# Patient Record
Sex: Female | Born: 1995 | Hispanic: Yes | Marital: Single | Smoking: Never smoker
Health system: Southern US, Community
[De-identification: ages and names within clinical notes are randomized; demographics above are authoritative.]

---
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
## 2017-01-08 ENCOUNTER — Encounter (HOSPITAL_BASED_OUTPATIENT_CLINIC_OR_DEPARTMENT_OTHER): Payer: Self-pay | Admitting: *Deleted

## 2017-01-08 ENCOUNTER — Emergency Department (HOSPITAL_BASED_OUTPATIENT_CLINIC_OR_DEPARTMENT_OTHER)
Admission: EM | Admit: 2017-01-08 | Discharge: 2017-01-08 | Disposition: A | Discharge status: Home / Self Care | Payer: Medicaid Other | Attending: Emergency Medicine | Admitting: Emergency Medicine

## 2017-01-08 DIAGNOSIS — R51 Headache: Secondary | ICD-10-CM | POA: Diagnosis not present

## 2017-01-08 DIAGNOSIS — R519 Headache, unspecified: Secondary | ICD-10-CM

## 2017-01-08 MED ORDER — KETOROLAC TROMETHAMINE 30 MG/ML IJ SOLN
15.0000 mg | Freq: Once | INTRAMUSCULAR | Status: AC
  Administered 2017-01-08: 15 mg via INTRAVENOUS
  Filled 2017-01-08: qty 1

## 2017-01-08 MED ORDER — SODIUM CHLORIDE 0.9 % IV BOLUS (SEPSIS)
1000.0000 mL | Freq: Once | INTRAVENOUS | Status: AC
  Administered 2017-01-08: 1000 mL via INTRAVENOUS

## 2017-01-08 NOTE — ED Provider Notes (Signed)
[facility]   CSN: [id number] Arrival date & time: 01/08/17  1436     History   Chief Complaint Chief Complaint  Patient presents with  . Headache    HPI Heather Hampton is a 21 y.o. female.  The history is provided by the patient and medical records. No language interpreter was used.  Headache     Heather LeschZayda Eberlein is an otherwise healthy 21 y.o. female who presents to the Emergency Department complaining of headache which started yesterday. She took an aspirin while at work and headache resolved. While she was at work today, her headache returned. She tried no medications prior to arrival for symptoms. No visual changes, nausea or vomiting. Sometimes the light will make it worse. No phonophobia. Denies history of migraine headaches. She also notes that she has been feeling lightheaded today. Denies any dizziness. No syncopal episodes. She is currently on her menstrual cycle and having mild  intermittent abdominal cramping, but this is typical for her. No dysuria, urinary urgency or frequency. No fevers chills.   History reviewed. No pertinent past medical history.  There are no active problems to display for this patient.   History reviewed. No pertinent surgical history.  OB History    No data available       Home Medications    Prior to Admission medications   Not on File    Family History History reviewed. No pertinent family history.  Social History Social History  Substance Use Topics  . Smoking status: Never Smoker  . Smokeless tobacco: Never Used  . Alcohol use No     Allergies   Patient has no known allergies.   Review of Systems Review of Systems  Gastrointestinal:       + abdominal cramping  Neurological: Positive for light-headedness and headaches. Negative for dizziness, syncope and weakness.  All other systems reviewed and are negative.    Physical Exam Updated Vital Signs BP 109/66 (BP Location: Left Arm)    Pulse 82   Temp 98.2 F (36.8 C) (Oral)   Resp 16   Ht 5\' 2"  (1.575 m)   Wt 81.6 kg (179 lb 14.3 oz)   LMP 01/08/2017   SpO2 100%   BMI 32.90 kg/m   Physical Exam  Constitutional: She is oriented to person, place, and time. She appears well-developed and well-nourished. No distress.  HENT:  Head: Normocephalic and atraumatic.  Eyes: Pupils are equal, round, and reactive to light. EOM are normal.  Cardiovascular: Normal rate, regular rhythm and normal heart sounds.   No murmur heard. Pulmonary/Chest: Effort normal and breath sounds normal. No respiratory distress.  Abdominal: Soft. Bowel sounds are normal. She exhibits no distension.  No abdominal tenderness.  Musculoskeletal: Normal range of motion.  Neurological: She is alert and oriented to person, place, and time.  Speech clear and goal oriented. CN 2-12 grossly intact. Normal finger-to-nose and rapid alternating movements. No drift. Strength and sensation intact. Steady gait.  Skin: Skin is warm and dry.  Nursing note and vitals reviewed.    ED Treatments / Results  Labs (all labs ordered are listed, but only abnormal results are displayed) Labs Reviewed - No data to display  EKG  EKG Interpretation None       Radiology No results found.  Procedures Procedures (including critical care time)  Medications Ordered in ED Medications  sodium chloride 0.9 % bolus 1,000 mL (1,000 mLs Intravenous New Bag/Given 01/08/17 1527)  ketorolac (TORADOL) 30 MG/ML injection  15 mg (15 mg Intravenous Given 01/08/17 1526)     Initial Impression / Assessment and Plan / ED Course  I have reviewed the triage vital signs and the nursing notes.  Pertinent labs & imaging results that were available during my care of the patient were reviewed by me and considered in my medical decision making (see chart for details).    Heather LeschZayda Teas is a 21 y.o. female who presents to ED for headache and lightheadedness. No focal neuro deficits  on exam. Well appearing and ambulating in ED without difficulty. Toradol and IV fluids given for symptom control.   On re-evaluation, patient feels improved. Symptoms resolved. The patient denies any neurologic symptoms such as visual changes, focal numbness/weakness, balance problems, confusion, or speech difficulty to suggest a life-threatening intracranial process such as intracranial hemorrhage or mass. The patient has no clotting risk factors thus venous sinus thrombosis is unlikely. No fevers, neck pain or nuchal rigidity to suggest meningitis. I feel that the patient is safe for discharge home at this time. PCP follow up if symptoms persist. I have reviewed return precautions including development of neurologic symptoms, confusion, lethargy, difficulty speaking, or new/worsening/concerning symptoms. All questions answered.   Final Clinical Impressions(s) / ED Diagnoses   Final diagnoses:  Bad headache    New Prescriptions New Prescriptions   No medications on file     Luree Palla, Chase PicketJaime Pilcher, Cordelia Poche-C 01/08/17 1608    Linwood DibblesKnapp, Jon, MD 01/12/17 [phone]

## 2017-01-08 NOTE — ED Triage Notes (Signed)
HA, light headed that started today. Ambulatory.

## 2017-01-08 NOTE — Discharge Instructions (Signed)
It was my pleasure taking care of you today!  Drink plenty of fluids at home. This will help with your headache.  Please follow up with your primary doctor if headache persists.   Fortunately, your evaluation today is reassuring with no apparent emergent cause for your headache at this time. With that being said, it is VERY important that you monitor your symptoms at home. If you develop worsening headache, new fever, new neck stiffness, rash, weakness, numbness, trouble with your speech, trouble walking, new or worsening symptoms or any concerning symptoms, please return to the ED immediately.

## 2017-06-17 ENCOUNTER — Other Ambulatory Visit: Payer: Self-pay

## 2017-06-17 ENCOUNTER — Encounter (HOSPITAL_BASED_OUTPATIENT_CLINIC_OR_DEPARTMENT_OTHER): Payer: Self-pay

## 2017-06-17 ENCOUNTER — Emergency Department (HOSPITAL_BASED_OUTPATIENT_CLINIC_OR_DEPARTMENT_OTHER)
Admission: EM | Admit: 2017-06-17 | Discharge: 2017-06-18 | Disposition: A | Discharge status: Home / Self Care | Payer: Medicaid Other | Attending: Emergency Medicine | Admitting: Emergency Medicine

## 2017-06-17 ENCOUNTER — Emergency Department (HOSPITAL_BASED_OUTPATIENT_CLINIC_OR_DEPARTMENT_OTHER): Payer: Medicaid Other

## 2017-06-17 DIAGNOSIS — R0981 Nasal congestion: Secondary | ICD-10-CM | POA: Diagnosis present

## 2017-06-17 DIAGNOSIS — B9789 Other viral agents as the cause of diseases classified elsewhere: Secondary | ICD-10-CM

## 2017-06-17 DIAGNOSIS — R05 Cough: Secondary | ICD-10-CM | POA: Diagnosis not present

## 2017-06-17 DIAGNOSIS — J069 Acute upper respiratory infection, unspecified: Secondary | ICD-10-CM | POA: Insufficient documentation

## 2017-06-17 MED ORDER — ACETAMINOPHEN 325 MG PO TABS
650.0000 mg | ORAL_TABLET | Freq: Once | ORAL | Status: AC
  Administered 2017-06-17: 650 mg via ORAL
  Filled 2017-06-17: qty 2

## 2017-06-17 NOTE — ED Triage Notes (Signed)
C/o flu like sx x 3 days-NAD-steady gait 

## 2017-06-18 MED ORDER — ALBUTEROL SULFATE HFA 108 (90 BASE) MCG/ACT IN AERS
2.0000 | INHALATION_SPRAY | RESPIRATORY_TRACT | Status: DC | PRN
  Administered 2017-06-18: 2 via RESPIRATORY_TRACT
  Filled 2017-06-18: qty 6.7

## 2017-06-18 MED ORDER — OXYMETAZOLINE HCL 0.05 % NA SOLN
2.0000 | Freq: Two times a day (BID) | NASAL | Status: DC | PRN
  Administered 2017-06-18: 2 via NASAL
  Filled 2017-06-18: qty 15

## 2017-06-18 NOTE — ED Notes (Signed)
Pt verbalizes understanding of d/c instructions and denies any further needs at this time. 

## 2017-06-18 NOTE — ED Provider Notes (Signed)
[facility] Provider Note: [name], MD, FACEP  CSN: [id number] MRN: [id number] ARRIVAL: 06/17/17 at 2235 ROOM: [facility]10/[facility]10   CHIEF COMPLAINT  Cough   HISTORY OF PRESENT ILLNESS  06/18/17 12:30 AM Heather Hampton is a 22 y.o. female with a 3-day history of flulike symptoms.  Specifically she has had subjective fever, malaise, nasal congestion, rhinorrhea, sore throat and cough.  She had vomiting the first day but this is resolved.  She has not had diarrhea.  The coughing exacerbates her sore throat.  She rates her pain as a 5 out of 10.  She has not taken anything for her symptoms.   History reviewed. No pertinent past medical history.  History reviewed. No pertinent surgical history.  No family history on file.  Social History   Tobacco Use  . Smoking status: Never Smoker  . Smokeless tobacco: Never Used  Substance Use Topics  . Alcohol use: No  . Drug use: No    Prior to Admission medications   Not on File    Allergies Patient has no known allergies.   REVIEW OF SYSTEMS  Negative except as noted here or in the History of Present Illness.   PHYSICAL EXAMINATION  Initial Vital Signs Blood pressure 131/66, pulse 99, temperature 98.2 F (36.8 C), temperature source Oral, resp. rate 16, height 5\' 2"  (1.575 m), weight 83 kg (182 lb 15.7 oz), last menstrual period 06/09/2017, SpO2 100 %.  Examination General: Well-developed, well-nourished female in no acute distress; appearance consistent with age of record HENT: normocephalic; atraumatic; nasal congestion; pharynx normal Eyes: pupils equal, round and reactive to light; extraocular muscles intact Neck: supple Heart: regular rate and rhythm Lungs: Creased air movement bilaterally without frank wheezing Abdomen: soft; nondistended; nontender; bowel sounds present Extremities: No deformity; full range of motion; pulses normal Neurologic: Awake, alert and oriented; motor function intact in all  extremities and symmetric; no facial droop Skin: Warm and dry Psychiatric: Normal mood and affect   RESULTS  Summary of this visit's results, reviewed by myself:   EKG Interpretation  Date/Time:    Ventricular Rate:    PR Interval:    QRS Duration:   QT Interval:    QTC Calculation:   R Axis:     Text Interpretation:        Laboratory Studies: No results found for this or any previous visit (from the past 24 hour(s)). Imaging Studies: Dg Chest 2 View  Result Date: 06/18/2017 CLINICAL DATA:  Cough.  Flu-like symptoms for 3 days. EXAM: CHEST  2 VIEW COMPARISON:  None. FINDINGS: The cardiomediastinal contours are normal. The lungs are clear. Pulmonary vasculature is normal. No consolidation, pleural effusion, or pneumothorax. No acute osseous abnormalities are seen. IMPRESSION: No active cardiopulmonary disease. Electronically Signed   By: Rubye Oaks M.D.   On: 06/18/2017 00:17    ED COURSE  Nursing notes and initial vitals signs, including pulse oximetry, reviewed.  Vitals:   06/17/17 2245 06/17/17 2246 06/17/17 2353  BP:  131/66   Pulse:  (!) 114 99  Resp:  16   Temp:  98.7 F (37.1 C) 98.2 F (36.8 C)  TempSrc:  Oral Oral  SpO2:  99% 100%  Weight: 83 kg (182 lb 15.7 oz)    Height: 5\' 2"  (1.575 m)     1:00 AM Air movement improved after albuterol treatment.  PROCEDURES    ED DIAGNOSES     ICD-10-CM   1. Viral URI with cough J06.9  B97.89        Marcele Kosta, Jonny RuizJohn, MD 06/18/17 0100

## 2018-11-22 IMAGING — CR DG CHEST 2V
2 series · 2 of 2 positions shown · non-contrast
Comparison: None.

CLINICAL DATA: Cough.  Flu-like symptoms for 3 days.

EXAM:
CHEST  2 VIEW

[w chest pa]
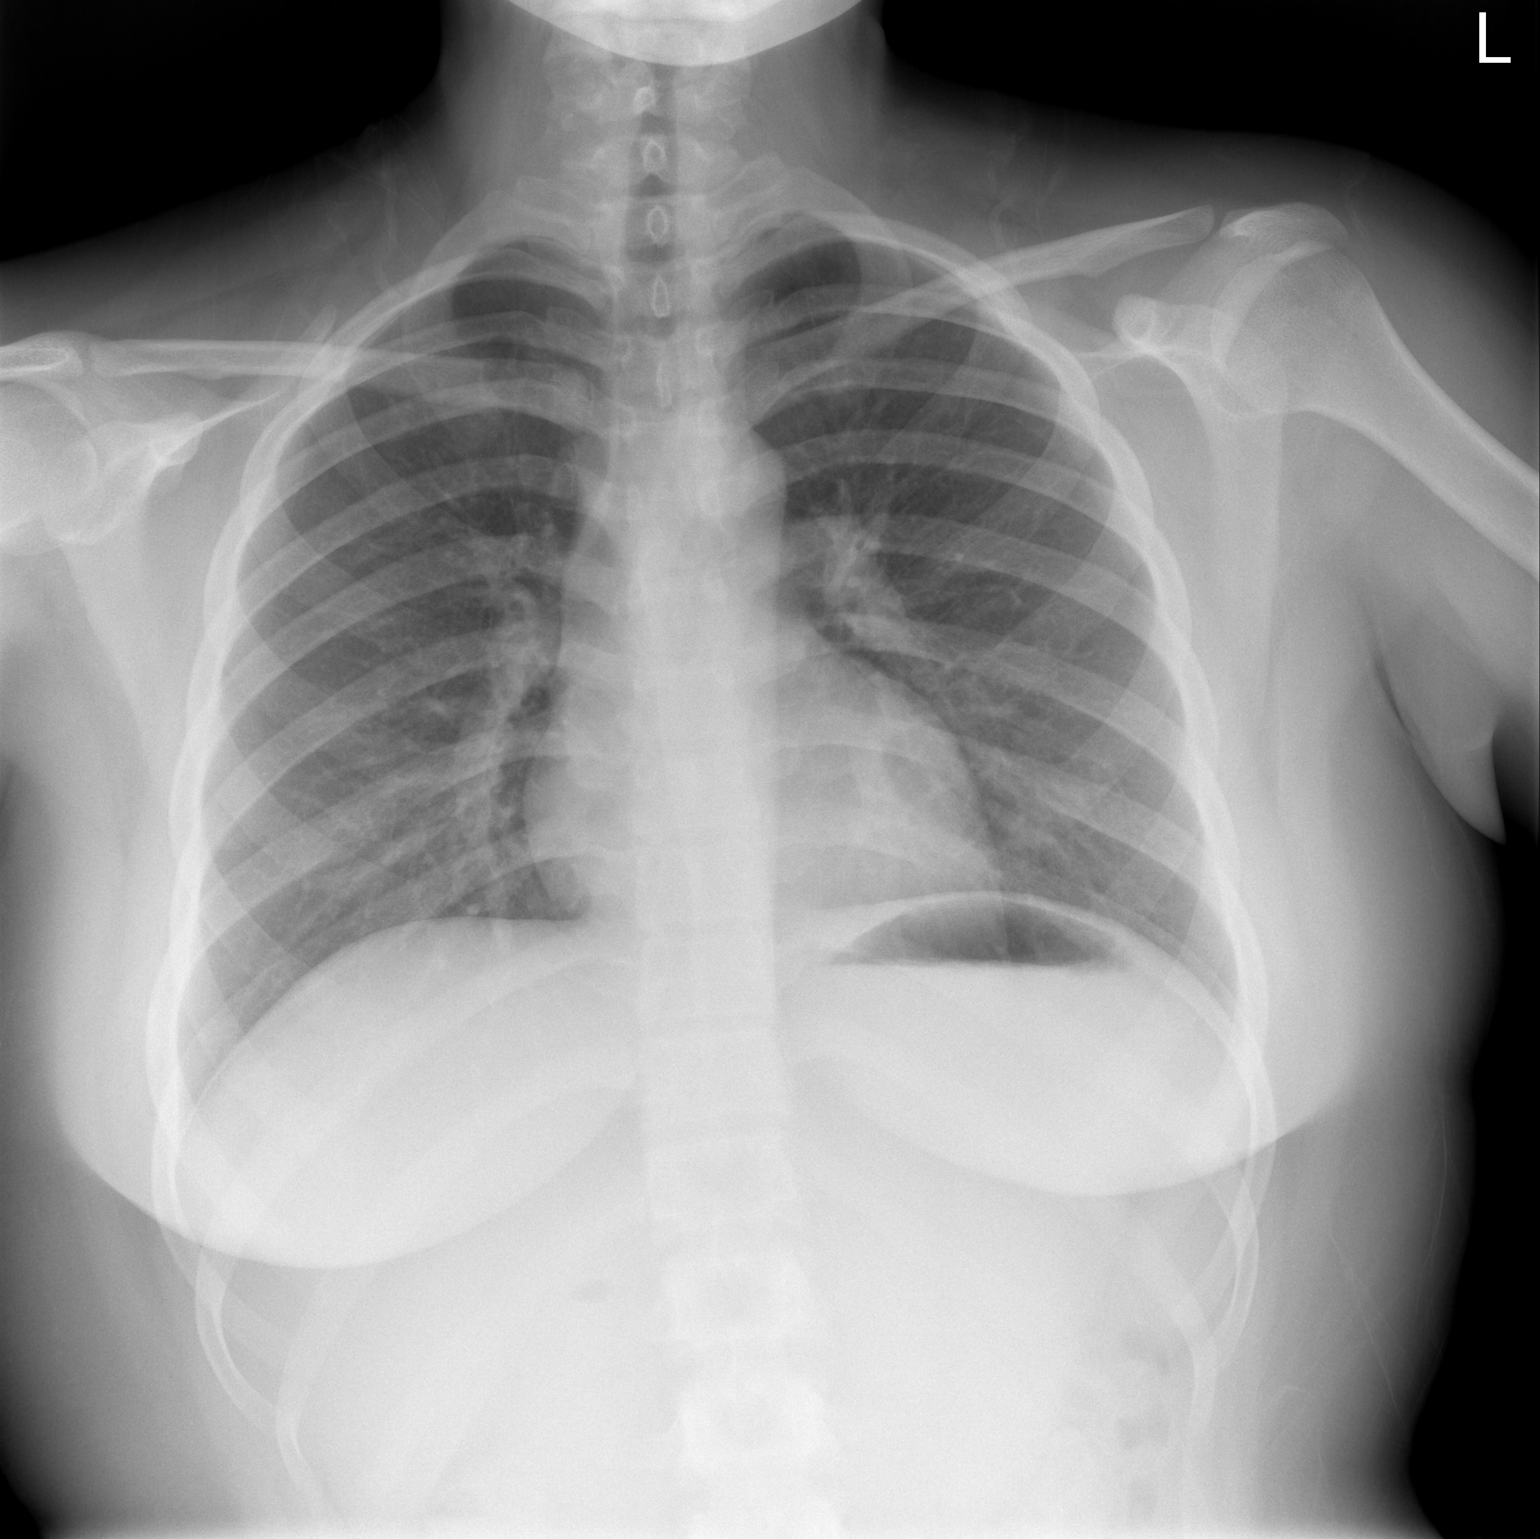

[w chest lat]
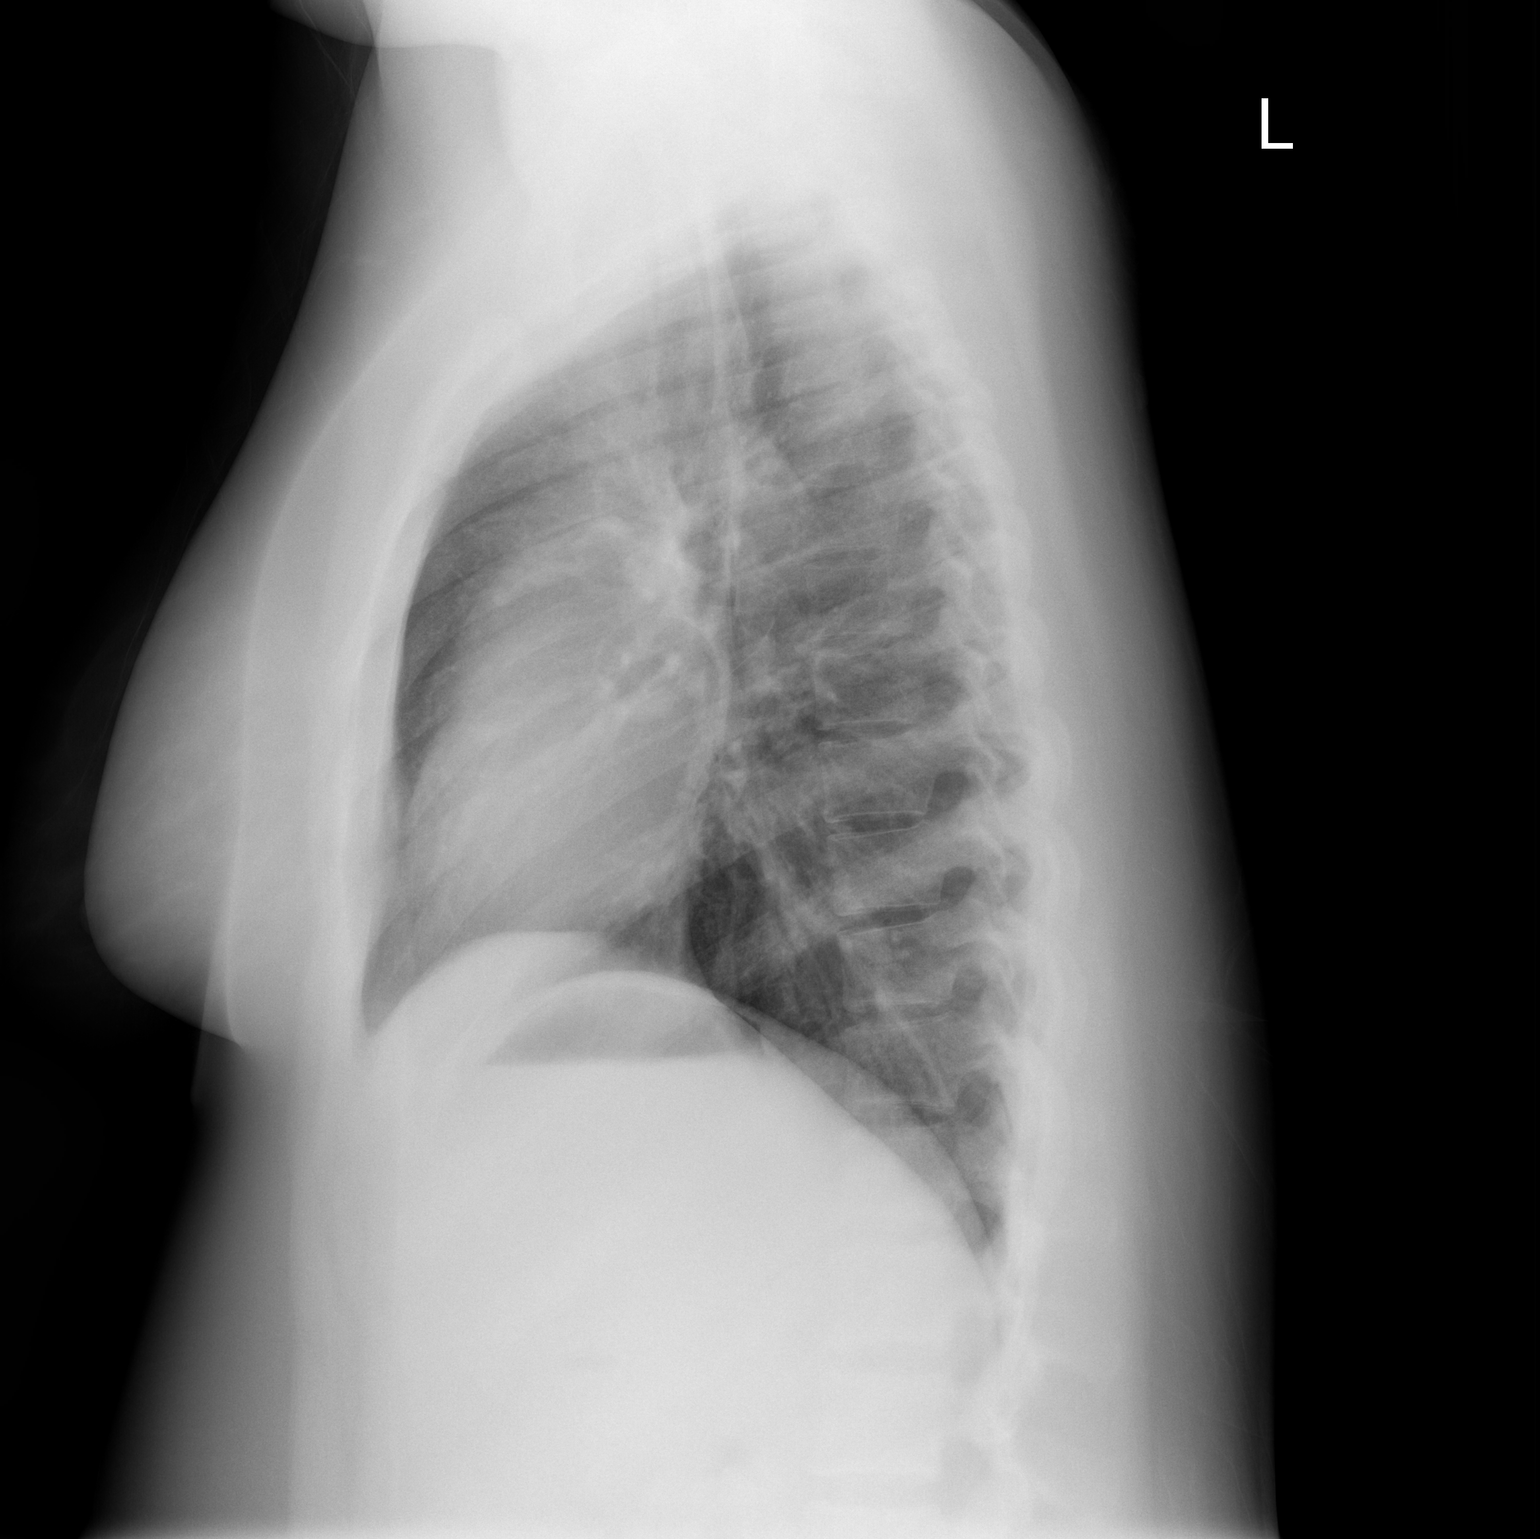

[2 of 2 positions shown; findings below may reference images not displayed]

FINDINGS: The cardiomediastinal contours are normal. The lungs are clear.
Pulmonary vasculature is normal. No consolidation, pleural effusion,
or pneumothorax. No acute osseous abnormalities are seen.
IMPRESSION: No active cardiopulmonary disease.

## 2019-09-20 ENCOUNTER — Emergency Department (HOSPITAL_COMMUNITY)
Admission: EM | Admit: 2019-09-20 | Discharge: 2019-09-20 | Disposition: A | Discharge status: Home / Self Care | Payer: Medicaid Other | Attending: Emergency Medicine | Admitting: Emergency Medicine

## 2019-09-20 ENCOUNTER — Emergency Department (HOSPITAL_COMMUNITY): Payer: Medicaid Other

## 2019-09-20 ENCOUNTER — Other Ambulatory Visit: Payer: Self-pay

## 2019-09-20 ENCOUNTER — Encounter (HOSPITAL_COMMUNITY): Payer: Self-pay | Admitting: Emergency Medicine

## 2019-09-20 DIAGNOSIS — Z20822 Contact with and (suspected) exposure to covid-19: Secondary | ICD-10-CM

## 2019-09-20 DIAGNOSIS — R519 Headache, unspecified: Secondary | ICD-10-CM | POA: Diagnosis not present

## 2019-09-20 DIAGNOSIS — U071 COVID-19: Secondary | ICD-10-CM | POA: Diagnosis not present

## 2019-09-20 DIAGNOSIS — R5383 Other fatigue: Secondary | ICD-10-CM | POA: Diagnosis not present

## 2019-09-20 DIAGNOSIS — R05 Cough: Secondary | ICD-10-CM | POA: Insufficient documentation

## 2019-09-20 DIAGNOSIS — B349 Viral infection, unspecified: Secondary | ICD-10-CM

## 2019-09-20 DIAGNOSIS — M7918 Myalgia, other site: Secondary | ICD-10-CM | POA: Diagnosis present

## 2019-09-20 LAB — I-STAT BETA HCG BLOOD, ED (MC, WL, AP ONLY): I-stat hCG, quantitative: <5 m[IU]/mL (ref <5)

## 2019-09-20 LAB — CBC WITH DIFFERENTIAL/PLATELET
Abs Immature Granulocytes: 0.01 10*3/uL (ref 0.00–0.07)
Basophils Absolute: 0 10*3/uL (ref 0.0–0.1)
Basophils Relative: 0 %
Eosinophils Absolute: 0 10*3/uL (ref 0.0–0.5)
Eosinophils Relative: 1 %
HCT: 41.3 % (ref 36.0–46.0)
Hemoglobin: 13.7 g/dL (ref 12.0–15.0)
Immature Granulocytes: 0 %
Lymphocytes Relative: 22 %
Lymphs Abs: 1.2 10*3/uL (ref 0.7–4.0)
MCH: 28.2 pg (ref 26.0–34.0)
MCHC: 33.2 g/dL (ref 30.0–36.0)
MCV: 85 fL (ref 80.0–100.0)
Monocytes Absolute: 0.8 10*3/uL (ref 0.1–1.0)
Monocytes Relative: 15 %
Neutro Abs: 3.3 10*3/uL (ref 1.7–7.7)
Neutrophils Relative %: 62 %
Platelets: 251 10*3/uL (ref 150–400)
RBC: 4.86 MIL/uL (ref 3.87–5.11)
RDW: 12.8 % (ref 11.5–15.5)
WBC: 5.3 10*3/uL (ref 4.0–10.5)
nRBC: 0 % (ref 0.0–0.2)

## 2019-09-20 LAB — COMPREHENSIVE METABOLIC PANEL
ALT: 15 U/L (ref 0–44)
AST: 14 U/L — ABNORMAL LOW (ref 15–41)
Albumin: 4 g/dL (ref 3.5–5.0)
Alkaline Phosphatase: 122 U/L (ref 38–126)
Anion gap: 11 (ref 5–15)
BUN: 9 mg/dL (ref 6–20)
CO2: 26 mmol/L (ref 22–32)
Calcium: 9.1 mg/dL (ref 8.9–10.3)
Chloride: 103 mmol/L (ref 98–111)
Creatinine, Ser: 0.68 mg/dL (ref 0.44–1.00)
GFR calc Af Amer: >60 mL/min (ref >60)
GFR calc non Af Amer: >60 mL/min (ref >60)
Glucose, Bld: 96 mg/dL (ref 70–99)
Potassium: 3.8 mmol/L (ref 3.5–5.1)
Sodium: 140 mmol/L (ref 135–145)
Total Bilirubin: 0.6 mg/dL (ref 0.3–1.2)
Total Protein: 7.3 g/dL (ref 6.5–8.1)

## 2019-09-20 LAB — SARS CORONAVIRUS 2 (TAT 6-24 HRS): SARS Coronavirus 2: POSITIVE — AB

## 2019-09-20 MED ORDER — ACETAMINOPHEN 500 MG PO TABS
1000.0000 mg | ORAL_TABLET | Freq: Once | ORAL | Status: AC
  Administered 2019-09-20: 1000 mg via ORAL
  Filled 2019-09-20: qty 2

## 2019-09-20 MED ORDER — SODIUM CHLORIDE 0.9 % IV BOLUS
1000.0000 mL | Freq: Once | INTRAVENOUS | Status: AC
Start: 2019-09-20 — End: 2019-09-20
  Administered 2019-09-20: 09:00:00 1000 mL via INTRAVENOUS

## 2019-09-20 MED ORDER — IBUPROFEN 400 MG PO TABS
600.0000 mg | ORAL_TABLET | Freq: Once | ORAL | Status: AC
  Administered 2019-09-20: 09:00:00 600 mg via ORAL
  Filled 2019-09-20: qty 1

## 2019-09-20 MED ORDER — BENZONATATE 100 MG PO CAPS: 100.0000 mg | ORAL_CAPSULE | Freq: Three times a day (TID) | ORAL | 0 refills | Status: AC

## 2019-09-20 NOTE — ED Notes (Addendum)
Patient pulse stayed at 93 during ambulating. o2 stayed at 100%

## 2019-09-20 NOTE — Discharge Instructions (Addendum)
You have been diagnosed today with viral illness, suspected COVID-19 viral infection.  At this time there does not appear to be the presence of an emergent medical condition, however there is always the potential for conditions to change. Please read and follow the below instructions.  Please return to the Emergency Department immediately for any new or worsening symptoms. Please be sure to follow up with your Primary Care Provider within one week regarding your visit today; please call their office to schedule an appointment even if you are feeling better for a follow-up visit. Your Covid test is pending, please follow-up on your results on your MyChart account in the next 1-2 days.  Discuss the results of your Covid test with your primary care provider at your follow-up visit this week.  Please drink plenty of water and get plenty of rest.  You may use the Tessalon medication as prescribed to help with cough. You may take Ibuprofen (Advil, motrin) and Tylenol (acetaminophen) to relieve your pain.  You may take up to 400 MG (2 pills) of normal strength ibuprofen every 8 hours as needed.  In between doses of ibuprofen you make take tylenol, up to 500 mg (one extra strength pill).  Do not take more than 3,000 mg tylenol in a 24 hour period.  Please check all medication labels as many medications such as pain and cold medications may contain tylenol.  Do not drink alcohol while taking these medications.  Do not take other NSAID'S while taking ibuprofen (such as aleve or naproxen).  Please take ibuprofen with food to decrease stomach upset.  Get help right away if: You have trouble breathing. You have pain or pressure in your chest. You have confusion. You have bluish lips and fingernails. You have difficulty waking from sleep. You have any new/concerning or worsening of symptoms  Please read the additional information packets attached to your discharge summary.  Do not take your medicine if  develop  an itchy rash, swelling in your mouth or lips, or difficulty breathing; call 911 and seek immediate emergency medical attention if this occurs.  Note: Portions of this text may have been transcribed using voice recognition software. Every effort was made to ensure accuracy; however, inadvertent computerized transcription errors may still be present.

## 2019-09-20 NOTE — ED Triage Notes (Signed)
Pt c/o generalized body ache, fever and HA since last night, pt denies any change on taste or smell.

## 2019-09-20 NOTE — ED Provider Notes (Addendum)
[facility] EMERGENCY DEPARTMENT Provider Note   CSN: [id number] Arrival date & time: 09/20/19  0316     History Chief Complaint  Patient presents with  . Generalized Body Aches    Heather Hampton is a 24 y.o. female otherwise healthy no daily medication use.  Onset 2 days ago generalized body aches, rhinorrhea/congestion, nonproductive cough, mild headache, fatigue.  Describes headache mild frontal pressure sensation constant improved with ibuprofen, no aggravating factors, no radiation.  Rhinorrhea is clear,.  Generalized body aches are mild aching sensation nonradiating constant improved with ibuprofen.  Associated with intermittent tactile fever but has not measured fever at home.  She last took medication yesterday afternoon which was 2 ibuprofen with some relief no medication today.  She reports that her brother has recently gotten over a similar illness, unsure if he has COVID-19 virus.  Denies fall/injury, vision changes, sore throat, neck stiffness, chest pain/shortness of breath, hemoptysis, abdominal pain, nausea/vomiting, diarrhea, extremity swelling/color change, numbness/weakness, tingling or any additional concerns.  HPI     History reviewed. No pertinent past medical history.  There are no problems to display for this patient.   History reviewed. No pertinent surgical history.   OB History   No obstetric history on file.     No family history on file.  Social History   Tobacco Use  . Smoking status: Never Smoker  . Smokeless tobacco: Never Used  Substance Use Topics  . Alcohol use: No  . Drug use: No    Home Medications Prior to Admission medications   Medication Sig Start Date End Date Taking? Authorizing Provider  benzonatate (TESSALON) 100 MG capsule Take 1 capsule (100 mg total) by mouth every 8 (eight) hours. 09/20/19   Bill Salinas, PA-C    Allergies    Patient has no known allergies.  Review of Systems   Review of  Systems Ten systems are reviewed and are negative for acute change except as noted in the HPI  Physical Exam Updated Vital Signs BP 107/72 (BP Location: Left Arm)   Pulse 98   Temp 99.2 F (37.3 C) (Oral)   Resp 18   Ht 5\' 3"  (1.6 m)   Wt 83 kg   SpO2 100%   BMI 32.41 kg/m   Physical Exam Constitutional:      General: She is not in acute distress.    Appearance: Normal appearance. She is well-developed. She is not ill-appearing or diaphoretic.  HENT:     Head: Normocephalic and atraumatic.     Jaw: There is normal jaw occlusion.     Right Ear: External ear normal.     Left Ear: External ear normal.     Nose: Rhinorrhea present. Rhinorrhea is clear.     Right Sinus: No maxillary sinus tenderness or frontal sinus tenderness.     Left Sinus: No maxillary sinus tenderness or frontal sinus tenderness.     Mouth/Throat:     Mouth: Mucous membranes are moist.     Pharynx: Oropharynx is clear.  Eyes:     General: Vision grossly intact. Gaze aligned appropriately.     Extraocular Movements: Extraocular movements intact.     Conjunctiva/sclera: Conjunctivae normal.     Pupils: Pupils are equal, round, and reactive to light.     Comments: Visual fields grossly intact bilaterally  Neck:     Trachea: Trachea and phonation normal. No tracheal deviation.     Meningeal: Brudzinski's sign absent.  Cardiovascular:  Rate and Rhythm: Normal rate and regular rhythm.     Pulses: Normal pulses.     Heart sounds: Normal heart sounds.  Pulmonary:     Effort: Pulmonary effort is normal. No respiratory distress.     Breath sounds: Normal breath sounds.  Abdominal:     General: There is no distension.     Palpations: Abdomen is soft.     Tenderness: There is no abdominal tenderness. There is no guarding or rebound.  Musculoskeletal:        General: Normal range of motion.     Cervical back: Full passive range of motion without pain, normal range of motion and neck supple.     Right lower  leg: No edema.     Left lower leg: No edema.  Skin:    General: Skin is warm and dry.  Neurological:     Mental Status: She is alert.     GCS: GCS eye subscore is 4. GCS verbal subscore is 5. GCS motor subscore is 6.     Comments: Speech is clear and goal oriented, follows commands Major Cranial nerves without deficit, no facial droop Normal strength in upper and lower extremities bilaterally including dorsiflexion and plantar flexion, strong and equal grip strength Sensation normal to light and sharp touch Moves extremities without ataxia, coordination intact Normal finger to nose and rapid alternating movements Neg romberg, no pronator drift Normal gait Normal heel-shin and balance Patient is able to jump from one leg to another without difficulty  Psychiatric:        Behavior: Behavior normal.     ED Results / Procedures / Treatments   Labs (all labs ordered are listed, but only abnormal results are displayed) Labs Reviewed  COMPREHENSIVE METABOLIC PANEL - Abnormal; Notable for the following components:      Result Value   AST 14 (*)    All other components within normal limits  SARS CORONAVIRUS 2 (TAT 6-24 HRS)  CBC WITH DIFFERENTIAL/PLATELET  I-STAT BETA HCG BLOOD, ED ([facility], [facility], [facility] ONLY)    EKG None  Radiology DG Chest Portable 1 View  Result Date: 09/20/2019 CLINICAL DATA:  Generalized body ache, fever and headache since last evening. EXAM: PORTABLE CHEST 1 VIEW COMPARISON:  06/18/2017 FINDINGS: The cardiac silhouette, mediastinal and hilar contours are normal. The lungs are clear. No pleural effusions. No pulmonary lesions. The bony thorax is intact. IMPRESSION: Normal chest x-ray. Electronically Signed   By: Marijo Sanes M.D.   On: 09/20/2019 07:57    Procedures Procedures (including critical care time)  Medications Ordered in ED Medications  acetaminophen (TYLENOL) tablet 1,000 mg (1,000 mg Oral Given 09/20/19 0332)  sodium chloride 0.9 % bolus 1,000 mL (1,000  mLs Intravenous New Bag/Given 09/20/19 0838)  ibuprofen (ADVIL) tablet 600 mg (600 mg Oral Given 09/20/19 6283)    ED Course  I have reviewed the triage vital signs and the nursing notes.  Pertinent labs & imaging results that were available during my care of the patient were reviewed by me and considered in my medical decision making (see chart for details).     Laneshia Pina was evaluated in Emergency Department on 09/20/2019 for the symptoms described in the history of present illness. She was evaluated in the context of the global COVID-19 pandemic, which necessitated consideration that the patient might be at risk for infection with the SARS-CoV-2 virus that causes COVID-19. Institutional protocols and algorithms that pertain to the evaluation of patients at risk for COVID-19  are in a state of rapid change based on information released by regulatory bodies including the CDC and federal and state organizations. These policies and algorithms were followed during the patient's care in the ED.  MDM Rules/Calculators/A&P                     MDM Number of Diagnoses or Management Options Suspected COVID-19 virus infection: minor Viral illness: minor   Amount and/or Complexity of Data Reviewed Clinical lab tests: ordered and reviewed Tests in the radiology section of CPT: ordered and reviewed Discussion of test results with the performing providers: no Decide to obtain previous medical records or to obtain history from someone other than the patient: yes Obtain history from someone other than the patient: no Review and summarize past medical records: yes Discuss the patient with other providers: yes (Dr. Anitra Lauth) Independent visualization of images, tracings, or specimens: yes  Risk of Complications, Morbidity, and/or Mortality Presenting problems: low Diagnostic procedures: low Management options: minimal  Critical Care Total time providing critical care: (none)  Patient Progress  Patient progress: improved  24 year old female presents today with viral illness onset 2 days ago, rhinorrhea, congestion, mild headache, nonproductive cough, body aches and fatigue.  On arrival to ER she is febrile and tachycardic, this improved following Tylenol given in triage, patient feeling much better upon my evaluation.  Cranial nerves intact, no meningeal signs, airway clear, heart regular rate and rhythm, lungs clear, abdomen soft nontender, neurovascular intact to all 4 extremities without evidence of DVT, normal neuro exam, well-appearing laughing and in no acute distress.  Labs obtained in triage include CBC which is within normal limits no anemia or leukocytosis to suggest bacterial infection.  Pregnancy test negative.  CMP shows AST 14 otherwise within normal limits no emergent electrolyte derangement, evidence of kidney injury or elevation of LFTs.  Will give patient fluid bolus for suspect dehydration and additional ibuprofen has been several hours and she received Tylenol.  Will obtain outpatient Covid test and have nurses ambulate patient.  A chest x-ray was ordered in triage which radiologist reads as "normal chest x-ray".  I personally reviewed patient's chest x-ray and agree with radiologist interpretation no obvious abnormalities, lungs appear clear. - Patient received fluid bolus and ibuprofen reports she is feeling much better repeat vital signs within normal limits tachycardia and fever have resolved.  She is ambulated with nursing staff without hypoxia on room air she is requesting discharge.  Will discharge patient with Tessalon and have her follow-up on her Covid test as outpatient discussed results with her primary care provider.  Work note provided patient aware to quarantine for 10 days.  Suspect patient symptoms are secondary to viral illness given sick contact, possibly COVID-19 viral infection.  No evidence of meningitis, bacterial process or other emergent pathologies  requiring antibiotics or further work-up at this time.  At this time there does not appear to be any evidence of an acute emergency medical condition and the patient appears stable for discharge with appropriate outpatient follow up. Diagnosis was discussed with patient who verbalizes understanding of care plan and is agreeable to discharge. I have discussed return precautions with patient who verbalizes understanding of return precautions. Patient encouraged to follow-up with their PCP. All questions answered.  Patient's case discussed with Dr. Anitra Lauth who agrees with plan to discharge with follow-up.   Note: Portions of this report may have been transcribed using voice recognition software. Every effort was made to ensure accuracy; however, inadvertent  computerized transcription errors may still be present. Final Clinical Impression(s) / ED Diagnoses Final diagnoses:  Viral illness  Suspected COVID-19 virus infection    Rx / DC Orders ED Discharge Orders         Ordered    benzonatate (TESSALON) 100 MG capsule  Every 8 hours     09/20/19 1030           Bill Salinas, PA-C 09/20/19 1046    Elizabeth Palau 09/20/19 1047    Gwyneth Sprout, MD 09/21/19 [phone]

## 2019-12-25 ENCOUNTER — Other Ambulatory Visit: Payer: Self-pay

## 2019-12-25 ENCOUNTER — Encounter (HOSPITAL_BASED_OUTPATIENT_CLINIC_OR_DEPARTMENT_OTHER): Payer: Self-pay | Admitting: *Deleted

## 2019-12-25 ENCOUNTER — Emergency Department (HOSPITAL_BASED_OUTPATIENT_CLINIC_OR_DEPARTMENT_OTHER)
Admission: EM | Admit: 2019-12-25 | Discharge: 2019-12-25 | Disposition: A | Discharge status: Home / Self Care | Payer: Medicaid Other | Attending: Emergency Medicine | Admitting: Emergency Medicine

## 2019-12-25 DIAGNOSIS — G8929 Other chronic pain: Secondary | ICD-10-CM | POA: Insufficient documentation

## 2019-12-25 DIAGNOSIS — M533 Sacrococcygeal disorders, not elsewhere classified: Secondary | ICD-10-CM | POA: Diagnosis not present

## 2019-12-25 DIAGNOSIS — M549 Dorsalgia, unspecified: Secondary | ICD-10-CM | POA: Diagnosis present

## 2019-12-25 LAB — URINALYSIS, ROUTINE W REFLEX MICROSCOPIC
Bilirubin Urine: NEGATIVE
Glucose, UA: NEGATIVE mg/dL
Ketones, ur: NEGATIVE mg/dL
Leukocytes,Ua: NEGATIVE
Nitrite: NEGATIVE
Protein, ur: NEGATIVE mg/dL
Specific Gravity, Urine: >1.03 — ABNORMAL HIGH (ref 1.005–1.030)
pH: 6 (ref 5.0–8.0)

## 2019-12-25 LAB — PREGNANCY, URINE: Preg Test, Ur: NEGATIVE

## 2019-12-25 LAB — URINALYSIS, MICROSCOPIC (REFLEX)

## 2019-12-25 MED ORDER — NAPROXEN 250 MG PO TABS
500.0000 mg | ORAL_TABLET | Freq: Once | ORAL | Status: AC
  Administered 2019-12-25: 500 mg via ORAL
  Filled 2019-12-25: qty 2

## 2019-12-25 MED ORDER — NAPROXEN 500 MG PO TABS: 500.0000 mg | ORAL_TABLET | Freq: Two times a day (BID) | ORAL | 0 refills | Status: AC

## 2019-12-25 NOTE — Discharge Instructions (Addendum)
You were seen today for back pain.  Most of your pain is over your SI joint.  This is likely inflammatory in nature.  Take naproxen twice daily.  Follow-up with sports medicine if not improving.

## 2019-12-25 NOTE — ED Provider Notes (Signed)
[facility] EMERGENCY DEPARTMENT Provider Note   CSN: [id number] Arrival date & time: 12/25/19  0122     History Chief Complaint  Patient presents with  . Back Pain    Heather Hampton is a 24 y.o. female.  HPI     This is a 24 year old female who presents with back pain.  Patient reports several month history of back pain.  She states that generally is worse at night.  It is sometimes worse with certain positions and standing up.  There is no radiation of the pain.  She describes dull pain that is just right of midline.  Currently her pain is 4 out of 10.  She states that she has taken "some things from my menstrual cramps" in the past and this has improved her pain.  She denies weakness, numbness, tingling, bowel or bladder difficulty.  She denies any dysuria or hematuria.  Denies fevers or other symptoms.  History reviewed. No pertinent past medical history.  There are no problems to display for this patient.   History reviewed. No pertinent surgical history.   OB History   No obstetric history on file.     No family history on file.  Social History   Tobacco Use  . Smoking status: Never Smoker  . Smokeless tobacco: Never Used  Substance Use Topics  . Alcohol use: No  . Drug use: No    Home Medications Prior to Admission medications   Medication Sig Start Date End Date Taking? Authorizing Provider  benzonatate (TESSALON) 100 MG capsule Take 1 capsule (100 mg total) by mouth every 8 (eight) hours. 09/20/19   Harlene Salts A, PA-C  naproxen (NAPROSYN) 500 MG tablet Take 1 tablet (500 mg total) by mouth 2 (two) times daily. 12/25/19   [place] Riesen, Mayer Masker, MD    Allergies    Patient has no known allergies.  Review of Systems   Review of Systems  Constitutional: Negative for fever.  Respiratory: Negative for shortness of breath.   Cardiovascular: Negative for chest pain.  Gastrointestinal: Negative for abdominal pain, diarrhea and vomiting.    Genitourinary: Negative for dysuria, flank pain and hematuria.  Musculoskeletal: Positive for back pain.  Neurological: Negative for weakness and numbness.  All other systems reviewed and are negative.   Physical Exam Updated Vital Signs BP 121/76 (BP Location: Right Arm)   Pulse 100   Temp 98.9 F (37.2 C) (Oral)   Resp 18   Ht 1.575 m (5\' 2" )   Wt 88.5 kg   LMP 12/25/2019   SpO2 100%   BMI 35.67 kg/m   Physical Exam Vitals and nursing note reviewed.  Constitutional:      Appearance: She is well-developed. She is obese. She is not ill-appearing.  HENT:     Head: Normocephalic and atraumatic.     Mouth/Throat:     Mouth: Mucous membranes are moist.  Eyes:     Pupils: Pupils are equal, round, and reactive to light.  Cardiovascular:     Rate and Rhythm: Normal rate and regular rhythm.     Heart sounds: Normal heart sounds.  Pulmonary:     Effort: Pulmonary effort is normal. No respiratory distress.     Breath sounds: No wheezing.  Abdominal:     General: Bowel sounds are normal.     Palpations: Abdomen is soft.     Tenderness: There is no abdominal tenderness.  Musculoskeletal:     Cervical back: Neck supple.     Comments:  Tenderness to palpation right SI joint, no midline tenderness to palpation, step-off, deformity Negative straight leg raise  Skin:    General: Skin is warm and dry.  Neurological:     Mental Status: She is alert and oriented to person, place, and time.     Comments: 5 out of 5 strength bilateral lower extremities, normal reflexes  Psychiatric:        Mood and Affect: Mood normal.     ED Results / Procedures / Treatments   Labs (all labs ordered are listed, but only abnormal results are displayed) Labs Reviewed  URINALYSIS, ROUTINE W REFLEX MICROSCOPIC - Abnormal; Notable for the following components:      Result Value   Specific Gravity, Urine >1.030 (*)    Hgb urine dipstick SMALL (*)    All other components within normal limits   URINALYSIS, MICROSCOPIC (REFLEX) - Abnormal; Notable for the following components:   Bacteria, UA MANY (*)    All other components within normal limits  PREGNANCY, URINE    EKG None  Radiology No results found.  Procedures Procedures (including critical care time)  Medications Ordered in ED Medications  naproxen (NAPROSYN) tablet 500 mg (has no administration in time range)    ED Course  I have reviewed the triage vital signs and the nursing notes.  Pertinent labs & imaging results that were available during my care of the patient were reviewed by me and considered in my medical decision making (see chart for details).    MDM Rules/Calculators/A&P                           Patient presents with back pain.  Overall nontoxic vital signs are reassuring.  Her pain is described as acute on chronic.  She has reproducible pain over the right SI joint which may suggest some inflammation there.  She does get better when she takes medication for her menstrual cramps which is likely an anti-inflammatory medication.  No symptoms of cauda equina or sciatica.  She has no urinary symptoms and history and exam are not consistent with kidney stones.  Urinalysis with 0-5 white cells and many bacteria.  Doubt UTI.  Recommend anti-inflammatories and sports medicine follow-up as needed.  After history, exam, and medical workup I feel the patient has been appropriately medically screened and is safe for discharge home. Pertinent diagnoses were discussed with the patient. Patient was given return precautions.   Final Clinical Impression(s) / ED Diagnoses Final diagnoses:  Chronic SI joint pain    Rx / DC Orders ED Discharge Orders         Ordered    naproxen (NAPROSYN) 500 MG tablet  2 times daily     Discontinue  Reprint     12/25/19 1610           Shon Baton, MD 12/25/19 [phone]

## 2019-12-25 NOTE — ED Triage Notes (Signed)
C/o lower back pain for "months" states pain was worse today. Denies any injury. Denies any urinary symptoms. States pain comes and goes. Denies radiation of pain down her legs.  Has not taken anything for pain.

## 2020-01-11 ENCOUNTER — Telehealth: Payer: Self-pay | Admitting: Family Medicine

## 2020-01-11 NOTE — Telephone Encounter (Signed)
Cld ED referral frm Dr. Wilkie Aye for Appt set up w/ Dr. Haydee Monica answer, message left for pt to call office if appt wanted.  --glh

## 2021-02-23 IMAGING — DX DG CHEST 1V PORT
1 series · 1 of 1 positions shown · non-contrast
Comparison: 06/18/2017

CLINICAL DATA: Generalized body ache, fever and headache since last
evening.

EXAM:
PORTABLE CHEST 1 VIEW

[chest]
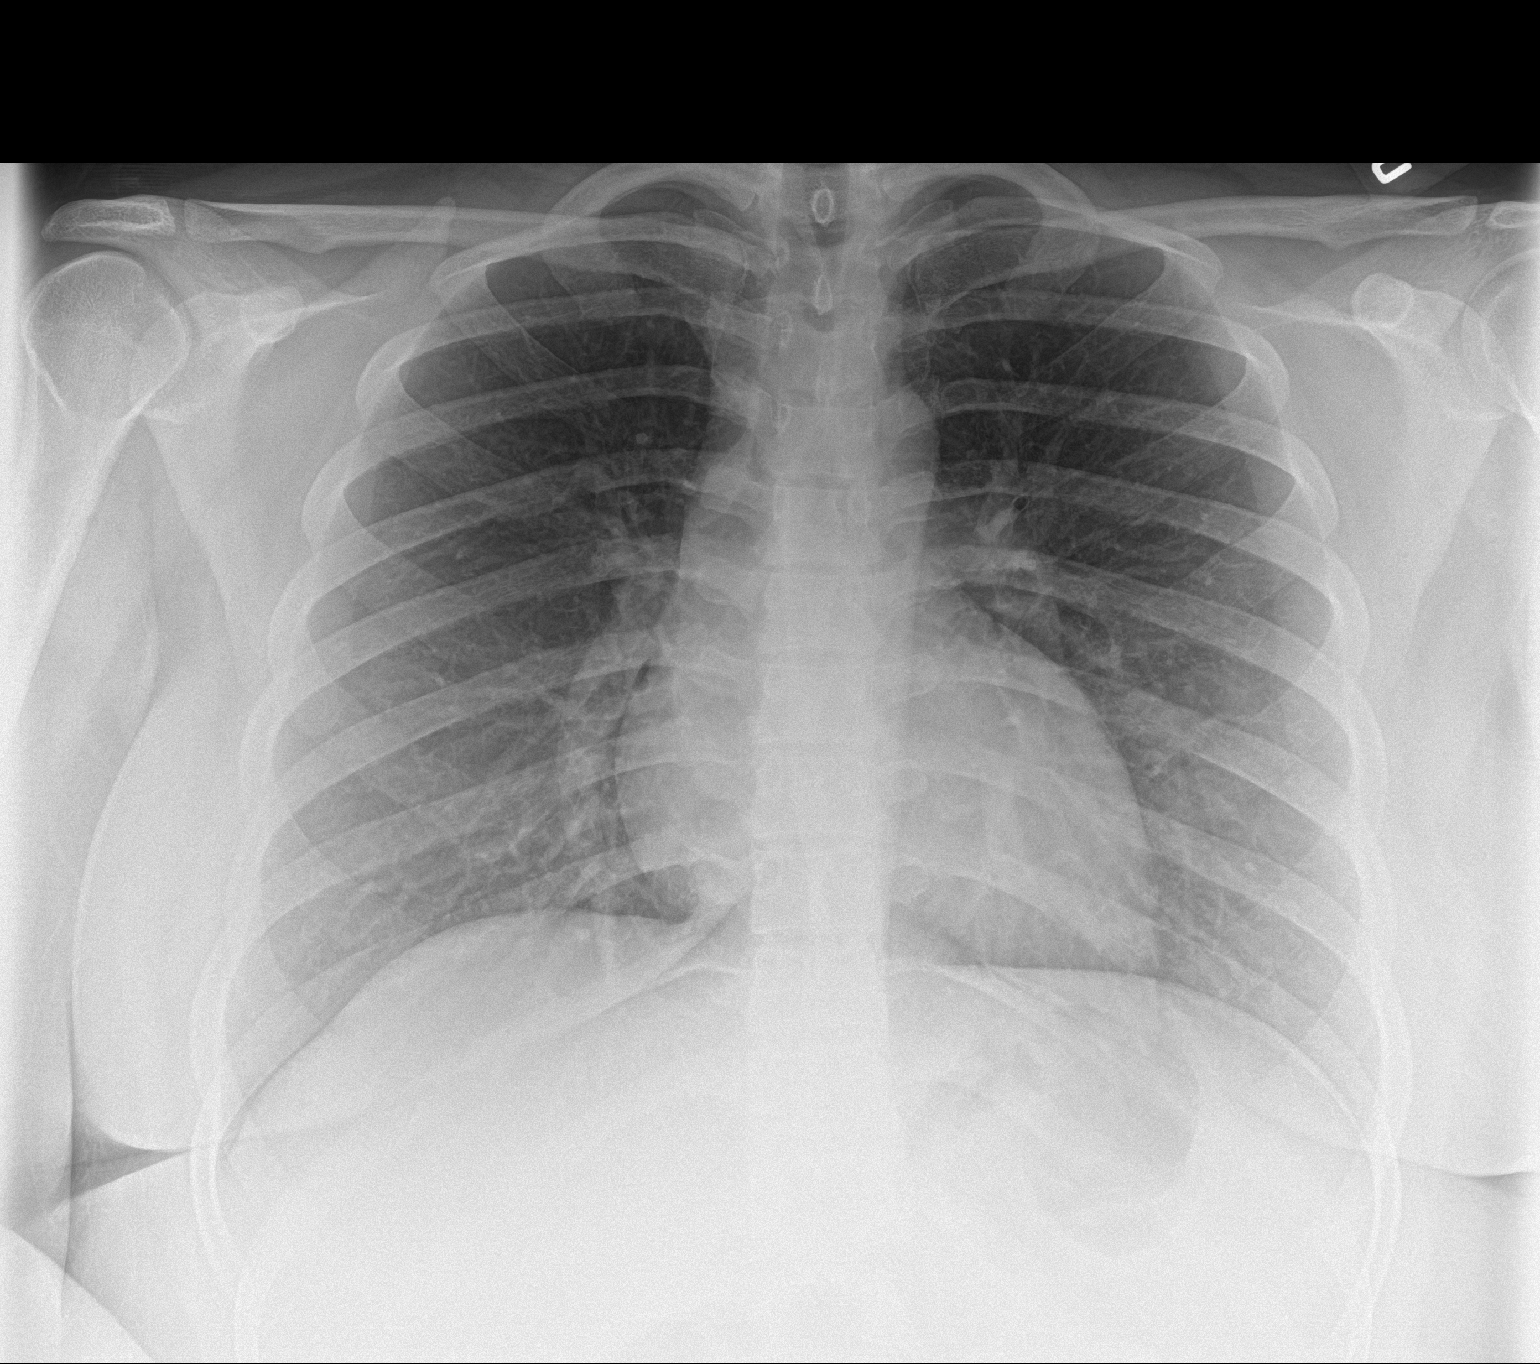

[1 of 1 positions shown; findings below may reference images not displayed]

FINDINGS: The cardiac silhouette, mediastinal and hilar contours are normal.
The lungs are clear. No pleural effusions. No pulmonary lesions. The
bony thorax is intact.
IMPRESSION: Normal chest x-ray.

## 2021-06-07 ENCOUNTER — Other Ambulatory Visit: Payer: Self-pay

## 2021-06-07 ENCOUNTER — Emergency Department
Admission: EM | Admit: 2021-06-07 | Discharge: 2021-06-07 | Disposition: A | Discharge status: Home / Self Care | Payer: Medicaid Other | Attending: Emergency Medicine | Admitting: Emergency Medicine

## 2021-06-07 DIAGNOSIS — R509 Fever, unspecified: Secondary | ICD-10-CM | POA: Diagnosis present

## 2021-06-07 DIAGNOSIS — Z28310 Unvaccinated for covid-19: Secondary | ICD-10-CM | POA: Diagnosis not present

## 2021-06-07 DIAGNOSIS — Z20822 Contact with and (suspected) exposure to covid-19: Secondary | ICD-10-CM | POA: Diagnosis not present

## 2021-06-07 DIAGNOSIS — J111 Influenza due to unidentified influenza virus with other respiratory manifestations: Secondary | ICD-10-CM

## 2021-06-07 LAB — CBC WITH DIFFERENTIAL/PLATELET
Abs Immature Granulocytes: 0.03 10*3/uL (ref 0.00–0.07)
Basophils Absolute: 0 10*3/uL (ref 0.0–0.1)
Basophils Relative: 0 %
Eosinophils Absolute: 0.2 10*3/uL (ref 0.0–0.5)
Eosinophils Relative: 2 %
HCT: 39.8 % (ref 36.0–46.0)
Hemoglobin: 13.8 g/dL (ref 12.0–15.0)
Immature Granulocytes: 0 %
Lymphocytes Relative: 20 %
Lymphs Abs: 2.2 10*3/uL (ref 0.7–4.0)
MCH: 28.4 pg (ref 26.0–34.0)
MCHC: 34.7 g/dL (ref 30.0–36.0)
MCV: 81.9 fL (ref 80.0–100.0)
Monocytes Absolute: 0.8 10*3/uL (ref 0.1–1.0)
Monocytes Relative: 7 %
Neutro Abs: 7.9 10*3/uL — ABNORMAL HIGH (ref 1.7–7.7)
Neutrophils Relative %: 71 %
Platelets: 326 10*3/uL (ref 150–400)
RBC: 4.86 MIL/uL (ref 3.87–5.11)
RDW: 12.8 % (ref 11.5–15.5)
WBC: 11.1 10*3/uL — ABNORMAL HIGH (ref 4.0–10.5)
nRBC: 0 % (ref 0.0–0.2)

## 2021-06-07 LAB — BASIC METABOLIC PANEL
Anion gap: 5 (ref 5–15)
BUN: 15 mg/dL (ref 6–20)
CO2: 25 mmol/L (ref 22–32)
Calcium: 8.9 mg/dL (ref 8.9–10.3)
Chloride: 106 mmol/L (ref 98–111)
Creatinine, Ser: 0.5 mg/dL (ref 0.44–1.00)
GFR, Estimated: >60 mL/min (ref >60)
Glucose, Bld: 137 mg/dL — ABNORMAL HIGH (ref 70–99)
Potassium: 3.6 mmol/L (ref 3.5–5.1)
Sodium: 136 mmol/L (ref 135–145)

## 2021-06-07 LAB — RESP PANEL BY RT-PCR (FLU A&B, COVID) ARPGX2
Influenza A by PCR: NEGATIVE
Influenza B by PCR: NEGATIVE
SARS Coronavirus 2 by RT PCR: NEGATIVE

## 2021-06-07 LAB — TSH: TSH: 2.818 u[IU]/mL (ref 0.350–4.500)

## 2021-06-07 LAB — MAGNESIUM: Magnesium: 2 mg/dL (ref 1.7–2.4)

## 2021-06-07 LAB — HCG, QUANTITATIVE, PREGNANCY: hCG, Beta Chain, Quant, S: 1 m[IU]/mL (ref <5)

## 2021-06-07 MED ORDER — KETOROLAC TROMETHAMINE 30 MG/ML IJ SOLN
30.0000 mg | Freq: Once | INTRAMUSCULAR | Status: AC
  Administered 2021-06-07: 09:00:00 30 mg via INTRAMUSCULAR
  Filled 2021-06-07: qty 1

## 2021-06-07 NOTE — ED Triage Notes (Signed)
Pt states she felt her heart racing, has a frontal headache and feels dizzy. Pt appears in no acute distress. Tp states symptoms began this am.

## 2021-06-07 NOTE — ED Provider Notes (Addendum)
[facility] Emergency Department Provider Note   ____________________________________________    I have reviewed the triage vital signs and the nursing notes.   HISTORY  Chief Complaint Dizziness     HPI Heather Hampton is a 25 y.o. female who reports she is having dizziness, body aches, fatigue and headache.  She has had chills as well.  She reports this symptom started last night.  She has not been vaccinated against COVID-19 nor influenza.  No nausea vomiting or abdominal pain.  Has not take anything for this.  No past medical history on file.  There are no problems to display for this patient.   No past surgical history on file.  Prior to Admission medications   Medication Sig Start Date End Date Taking? Authorizing Provider  benzonatate (TESSALON) 100 MG capsule Take 1 capsule (100 mg total) by mouth every 8 (eight) hours. 09/20/19   Harlene Salts A, PA-C  naproxen (NAPROSYN) 500 MG tablet Take 1 tablet (500 mg total) by mouth 2 (two) times daily. 12/25/19   Horton, Mayer Masker, MD     Allergies Shellfish allergy  No family history on file.  Social History Social History   Tobacco Use   Smoking status: Never   Smokeless tobacco: Never  Substance Use Topics   Alcohol use: No   Drug use: No    Review of Systems  Constitutional: Positive chills  ENT: Mild sore throat   Gastrointestinal: No abdominal pain.  No nausea, no vomiting.   Genitourinary: Negative for dysuria. Musculoskeletal: Myalgia Skin: Negative for rash. Neurological: As above    ____________________________________________   PHYSICAL EXAM:  VITAL SIGNS: ED Triage Vitals  Enc Vitals Group     BP 06/07/21 0309 117/86     Pulse Rate 06/07/21 0309 (!) 112     Resp 06/07/21 0309 16     Temp 06/07/21 0309 98.9 F (37.2 C)     Temp Source 06/07/21 0637 Oral     SpO2 06/07/21 0309 96 %     Weight 06/07/21 0309 81.6 kg (180 lb)     Height 06/07/21 0309  1.6 m (5\' 3" )     Head Circumference --      Peak Flow --      Pain Score 06/07/21 0309 7     Pain Loc --      Pain Edu? --      Excl. in [place]? --      Constitutional: Alert and oriented. No acute distress. Pleasant and interactive Eyes: Conjunctivae are normal.  PERRLA Head: Atraumatic. Nose: No congestion/rhinnorhea. Mouth/Throat: Mucous membranes are moist.  Pharynx is normal, no discharge Cardiovascular: Normal rate, regular rhythm.  Respiratory: Normal respiratory effort.  No retractions.  Clear to auscultation bilaterally Genitourinary: deferred Musculoskeletal: No lower extremity tenderness nor edema.   Neurologic:  Normal speech and language. No gross focal neurologic deficits are appreciated.   Skin:  Skin is warm, dry and intact. No rash noted.   ____________________________________________   LABS (all labs ordered are listed, but only abnormal results are displayed)  Labs Reviewed  CBC WITH DIFFERENTIAL/PLATELET - Abnormal; Notable for the following components:      Result Value   WBC 11.1 (*)    Neutro Abs 7.9 (*)    All other components within normal limits  BASIC METABOLIC PANEL - Abnormal; Notable for the following components:   Glucose, Bld 137 (*)    All other components within normal limits  RESP PANEL BY  RT-PCR (FLU A&B, COVID) ARPGX2  MAGNESIUM  TSH  HCG, QUANTITATIVE, PREGNANCY   ____________________________________________  EKG   ____________________________________________  RADIOLOGY   ____________________________________________   PROCEDURES  Procedure(s) performed: No  Procedures   Critical Care performed: No ____________________________________________   INITIAL IMPRESSION / ASSESSMENT AND PLAN / ED COURSE  Pertinent labs & imaging results that were available during my care of the patient were reviewed by me and considered in my medical decision making (see chart for details).   Patient presents with symptoms consistent with  early viral illness, both COVID and influenza are prevalent in the community at this time.  We will send COVID/flu PCR  Lab work today is reassuring, mildly elevated white blood cell count is nonspecific, otherwise normal lab work  Appropriate for discharge with supportive care, return precautions and outpatient follow-up discussed     ____________________________________________   FINAL CLINICAL IMPRESSION(S) / ED DIAGNOSES  Final diagnoses:  Influenza-like illness      NEW MEDICATIONS STARTED DURING THIS VISIT:  Discharge Medication List as of 06/07/2021  8:54 AM       Note:  This document was prepared using Dragon voice recognition software and may include unintentional dictation errors.    Jene Every, MD 06/07/21 [id number]    Jene Every, MD 06/07/21 [phone]

## 2022-08-21 ENCOUNTER — Emergency Department
Admission: EM | Admit: 2022-08-21 | Discharge: 2022-08-22 | Disposition: A | Discharge status: Home / Self Care | Payer: Medicaid Other | Attending: Emergency Medicine | Admitting: Emergency Medicine

## 2022-08-21 ENCOUNTER — Other Ambulatory Visit: Payer: Self-pay

## 2022-08-21 DIAGNOSIS — K2900 Acute gastritis without bleeding: Secondary | ICD-10-CM

## 2022-08-21 DIAGNOSIS — R109 Unspecified abdominal pain: Secondary | ICD-10-CM | POA: Diagnosis present

## 2022-08-21 DIAGNOSIS — K2960 Other gastritis without bleeding: Secondary | ICD-10-CM | POA: Insufficient documentation

## 2022-08-21 LAB — CBC
HCT: 42.6 % (ref 36.0–46.0)
Hemoglobin: 14.2 g/dL (ref 12.0–15.0)
MCH: 27.4 pg (ref 26.0–34.0)
MCHC: 33.3 g/dL (ref 30.0–36.0)
MCV: 82.1 fL (ref 80.0–100.0)
Platelets: 328 10*3/uL (ref 150–400)
RBC: 5.19 MIL/uL — ABNORMAL HIGH (ref 3.87–5.11)
RDW: 13.2 % (ref 11.5–15.5)
WBC: 11.7 10*3/uL — ABNORMAL HIGH (ref 4.0–10.5)
nRBC: 0 % (ref 0.0–0.2)

## 2022-08-21 NOTE — ED Triage Notes (Signed)
Pt presents to ER with c/o epigastric area abd pain that has been ongoing for a few days, and vomiting that started today.  Pt states she has vomited appx 3-4 times today.  Pt denies hx of pancreatitis or gall bladder issues.  Pt denies radiation of pain but states it has been constant.  Pt is otherwise A&O x4 and in NAD at this time.

## 2022-08-21 NOTE — ED Notes (Signed)
PT brought to ed rm 2  at this time, this RN now assuming care.  

## 2022-08-21 NOTE — ED Provider Notes (Signed)
[facility] Provider Note    Event Date/Time   First MD Initiated Contact with Patient 08/21/22 2357     (approximate)   History   Abdominal Pain and Emesis   HPI  Heather Hampton is a 27 y.o. female who presents to the ED for evaluation of Abdominal Pain and Emesis   Obese patient presents to the ED with her mother for evaluation of epigastric pain over the past few days and emesis this evening.  She reports 2 to 3 days of epigastric pain without nausea or emesis, but felt nauseous today and did have approximately 3 episodes of nonbloody nonbilious emesis.  She reports eating chicken around 2 PM and this improved her epigastric pain for about an hour before the pain did return and recur.  No fevers, chest pain or dyspnea, choking spells, urinary changes.  No history of intra-abdominal surgeries   Physical Exam   Triage Vital Signs: ED Triage Vitals  Enc Vitals Group     BP 08/21/22 2330 111/76     Pulse Rate 08/21/22 2330 95     Resp 08/21/22 2330 18     Temp 08/21/22 2330 98.4 F (36.9 C)     Temp Source 08/21/22 2330 Oral     SpO2 08/21/22 2330 97 %     Weight 08/21/22 2331 180 lb (81.6 kg)     Height 08/21/22 2331 '5\' 3"'$  (1.6 m)     Head Circumference --      Peak Flow --      Pain Score 08/21/22 2331 6     Pain Loc --      Pain Edu? --      Excl. in [place]? --     Most recent vital signs: Vitals:   08/21/22 2330 08/22/22 0045  BP: 111/76   Pulse: 95 82  Resp: 18   Temp: 98.4 F (36.9 C)   SpO2: 97% 97%    General: Awake, no distress.  CV:  Good peripheral perfusion.  Resp:  Normal effort.  Abd:  No distention.  Minimal epigastric tenderness without peritoneal features.  Abdomen is otherwise benign MSK:  No deformity noted.  Neuro:  No focal deficits appreciated. Other:     ED Results / Procedures / Treatments   Labs (all labs ordered are listed, but only abnormal results are displayed) Labs Reviewed  CBC - Abnormal;  Notable for the following components:      Result Value   WBC 11.7 (*)    RBC 5.19 (*)    All other components within normal limits  COMPREHENSIVE METABOLIC PANEL - Abnormal; Notable for the following components:   Glucose, Bld 108 (*)    Total Protein 8.4 (*)    Alkaline Phosphatase 128 (*)    All other components within normal limits  LIPASE, BLOOD  URINALYSIS, ROUTINE W REFLEX MICROSCOPIC  POC URINE PREG, ED    EKG   RADIOLOGY RUQ ultrasound interpreted by me without evidence of acute pathology  Official radiology report(s): US ABDOMEN LIMITED RUQ (LIVER/GB)  Result Date: 08/22/2022 CLINICAL DATA:  Epigastric pain, nausea and vomiting EXAM: ULTRASOUND ABDOMEN LIMITED RIGHT UPPER QUADRANT COMPARISON:  None Available. FINDINGS: Gallbladder: No gallstones or wall thickening visualized. No sonographic Murphy sign noted by sonographer. Common bile duct: Diameter: 2 mm, within normal limits. No intrahepatic biliary ductal dilatation. Liver: No focal lesion identified. Within normal limits in parenchymal echogenicity. Portal vein is patent on color Doppler imaging with normal direction of blood  flow towards the liver. Other: None. IMPRESSION: Unremarkable right upper quadrant ultrasound. Electronically Signed   By: Merilyn Baba M.D.   On: 08/22/2022 01:12    PROCEDURES and INTERVENTIONS:  Procedures  Medications  alum & mag hydroxide-simeth (MAALOX/MYLANTA) 200-200-20 MG/5ML suspension 30 mL (30 mLs Oral Given 08/22/22 0038)  sucralfate (CARAFATE) tablet 1 g (1 g Oral Given 08/22/22 0038)  famotidine (PEPCID) tablet 20 mg (20 mg Oral Given 08/22/22 0037)     IMPRESSION / MDM / ASSESSMENT AND PLAN / ED COURSE  I reviewed the triage vital signs and the nursing notes.  Differential diagnosis includes, but is not limited to, gastritis, GERD, cholelithiasis or cholecystitis, lower lobe pneumonia, pancreatitis  {Patient presents with symptoms of an acute illness or injury that is  potentially life-threatening.  Obese 27 year old woman presents with epigastric discomfort, nausea and emesis, likely gastric in etiology such as gastritis and suitable for outpatient management.  Looks well with normal vitals and reassuring exam.  Minimal tenderness but no peritoneal features.  Otherwise normal exam.  Normal lipase.  Normal LFTs.  Marginal leukocytosis.  RUQ ultrasound is benign.  Suspect gastric etiology.  Discharged with H2 blocker and discussed return precautions.  Clinical Course as of 08/22/22 0122  Sat Aug 22, 2022  0118 Reassessed.  She reports feeling better after the medications.  We discussed normal ultrasound and likely gastric etiology of her symptoms.  Discussed starting famotidine and following up with her PCP.  Discussed return precautions. [DS]    Clinical Course User Index [DS] Vladimir Crofts, MD     FINAL CLINICAL IMPRESSION(S) / ED DIAGNOSES   Final diagnoses:  Other acute gastritis without hemorrhage     Rx / DC Orders   ED Discharge Orders          Ordered    famotidine (PEPCID) 20 MG tablet  Daily        08/22/22 0119             Note:  This document was prepared using Dragon voice recognition software and may include unintentional dictation errors.   Vladimir Crofts, MD 08/22/22 [phone]

## 2022-08-22 ENCOUNTER — Emergency Department: Payer: Medicaid Other

## 2022-08-22 LAB — COMPREHENSIVE METABOLIC PANEL
ALT: 20 U/L (ref 0–44)
AST: 18 U/L (ref 15–41)
Albumin: 4.4 g/dL (ref 3.5–5.0)
Alkaline Phosphatase: 128 U/L — ABNORMAL HIGH (ref 38–126)
Anion gap: 11 (ref 5–15)
BUN: 13 mg/dL (ref 6–20)
CO2: 24 mmol/L (ref 22–32)
Calcium: 9.1 mg/dL (ref 8.9–10.3)
Chloride: 103 mmol/L (ref 98–111)
Creatinine, Ser: 0.63 mg/dL (ref 0.44–1.00)
GFR, Estimated: >60 mL/min (ref >60)
Glucose, Bld: 108 mg/dL — ABNORMAL HIGH (ref 70–99)
Potassium: 3.7 mmol/L (ref 3.5–5.1)
Sodium: 138 mmol/L (ref 135–145)
Total Bilirubin: 0.9 mg/dL (ref 0.3–1.2)
Total Protein: 8.4 g/dL — ABNORMAL HIGH (ref 6.5–8.1)

## 2022-08-22 LAB — URINALYSIS, ROUTINE W REFLEX MICROSCOPIC
Bilirubin Urine: NEGATIVE
Glucose, UA: NEGATIVE mg/dL
Hgb urine dipstick: NEGATIVE
Ketones, ur: NEGATIVE mg/dL
Leukocytes,Ua: NEGATIVE
Nitrite: NEGATIVE
Protein, ur: 30 mg/dL — AB
Specific Gravity, Urine: 1.035 — ABNORMAL HIGH (ref 1.005–1.030)
pH: 5 (ref 5.0–8.0)

## 2022-08-22 LAB — LIPASE, BLOOD: Lipase: 33 U/L (ref 11–51)

## 2022-08-22 LAB — POC URINE PREG, ED: Preg Test, Ur: NEGATIVE

## 2022-08-22 MED ORDER — SUCRALFATE 1 G PO TABS
1.0000 g | ORAL_TABLET | Freq: Once | ORAL | Status: AC
  Administered 2022-08-22: 1 g via ORAL
  Filled 2022-08-22: qty 1

## 2022-08-22 MED ORDER — FAMOTIDINE 20 MG PO TABS: 20.0000 mg | ORAL_TABLET | Freq: Every day | ORAL | 1 refills | Status: AC

## 2022-08-22 MED ORDER — ALUM & MAG HYDROXIDE-SIMETH 200-200-20 MG/5ML PO SUSP
30.0000 mL | Freq: Once | ORAL | Status: AC
  Administered 2022-08-22: 30 mL via ORAL
  Filled 2022-08-22: qty 30

## 2022-08-22 MED ORDER — FAMOTIDINE 20 MG PO TABS
20.0000 mg | ORAL_TABLET | Freq: Once | ORAL | Status: AC
Start: 2022-08-22 — End: 2022-08-22
  Administered 2022-08-22: 20 mg via ORAL
  Filled 2022-08-22: qty 1

## 2022-08-22 NOTE — ED Notes (Signed)
US at bedside

## 2022-08-22 NOTE — Discharge Instructions (Addendum)
Please start taking famotidine/Pepcid once daily every day to help prevent too much acid production of the stomach and should help with your pain.

## 2022-08-22 NOTE — ED Notes (Signed)
ED Provider at bedside. 

## 2022-08-22 NOTE — ED Notes (Signed)
Pt verbalized understanding of DC instructions. Signing pad did not work.   

## 2022-09-28 ENCOUNTER — Encounter: Payer: Self-pay | Admitting: *Deleted

## 2023-04-04 ENCOUNTER — Other Ambulatory Visit: Payer: Self-pay

## 2023-04-04 ENCOUNTER — Emergency Department
Admission: EM | Admit: 2023-04-04 | Discharge: 2023-04-04 | Disposition: A | Discharge status: Home / Self Care | Payer: Medicaid Other | Attending: Emergency Medicine | Admitting: Emergency Medicine

## 2023-04-04 DIAGNOSIS — J101 Influenza due to other identified influenza virus with other respiratory manifestations: Secondary | ICD-10-CM | POA: Insufficient documentation

## 2023-04-04 DIAGNOSIS — Z20822 Contact with and (suspected) exposure to covid-19: Secondary | ICD-10-CM | POA: Insufficient documentation

## 2023-04-04 DIAGNOSIS — R059 Cough, unspecified: Secondary | ICD-10-CM | POA: Diagnosis present

## 2023-04-04 LAB — RESP PANEL BY RT-PCR (RSV, FLU A&B, COVID)  RVPGX2
Influenza A by PCR: POSITIVE — AB
Influenza B by PCR: NEGATIVE
Resp Syncytial Virus by PCR: NEGATIVE
SARS Coronavirus 2 by RT PCR: NEGATIVE

## 2023-04-04 MED ORDER — IBUPROFEN 600 MG PO TABS: 600.0000 mg | ORAL_TABLET | Freq: Four times a day (QID) | ORAL | 0 refills | Status: AC | PRN

## 2023-04-04 MED ORDER — IBUPROFEN 600 MG PO TABS
600.0000 mg | ORAL_TABLET | Freq: Once | ORAL | Status: AC
Start: 2023-04-04 — End: 2023-04-04
  Administered 2023-04-04: 600 mg via ORAL
  Filled 2023-04-04: qty 1

## 2023-04-04 MED ORDER — PSEUDOEPH-BROMPHEN-DM 30-2-10 MG/5ML PO SYRP: 5.0000 mL | ORAL_SOLUTION | Freq: Four times a day (QID) | ORAL | 0 refills | Status: AC | PRN

## 2023-04-04 NOTE — ED Triage Notes (Signed)
Pt reports sinus pressure and pain for several days. Pt reports started with nasal congestion and drainage and a cough and has spread to her face. Denies recent exposures. Pt reports has ran a fever as well but unsure of temp because she does not have a thermometer.

## 2023-04-04 NOTE — Discharge Instructions (Addendum)
Follow-up with your primary care provider if any continued problems or concerns.  Make sure that you are drinking plenty of fluids to stay hydrated.  Bromfed-DM and ibuprofen 600 mg was sent to the pharmacy for you to begin taking.  Be aware that influenza is contagious and other family members may get sick.

## 2023-04-04 NOTE — ED Provider Notes (Signed)
[facility] Provider Note    Event Date/Time   First MD Initiated Contact with Patient 04/04/23 1122     (approximate)   History   Nasal Congestion, Cough, Sore, Facial Pain, and Fever   HPI  Heather Hampton is a 27 y.o. female   presents to the ED with complaint of sinus pressure and pain for several days along with nasal congestion and drainage.  Patient states that she has had a cough and also fever but does not have a thermometer.  She is not aware of any known sick exposures.    Physical Exam   Triage Vital Signs: ED Triage Vitals  Encounter Vitals Group     BP 04/04/23 1054 130/72     Systolic BP Percentile --      Diastolic BP Percentile --      Pulse Rate 04/04/23 1054 (!) 106     Resp 04/04/23 1054 18     Temp 04/04/23 1054 100.2 F (37.9 C)     Temp Source 04/04/23 1054 Oral     SpO2 04/04/23 1054 98 %     Weight 04/04/23 1048 180 lb (81.6 kg)     Height 04/04/23 1048 5\' 3"  (1.6 m)     Head Circumference --      Peak Flow --      Pain Score 04/04/23 1048 8     Pain Loc --      Pain Education --      Exclude from Growth Chart --     Most recent vital signs: Vitals:   04/04/23 1054  BP: 130/72  Pulse: (!) 106  Resp: 18  Temp: 100.2 F (37.9 C)  SpO2: 98%     General: Awake, no distress.  CV:  Good peripheral perfusion.  Heart regular rate rhythm. Resp:  Normal effort.  Lungs are clear bilaterally. Abd:  No distention.  Other:  Positive nasal congestion.   ED Results / Procedures / Treatments   Labs (all labs ordered are listed, but only abnormal results are displayed) Labs Reviewed  RESP PANEL BY RT-PCR (RSV, FLU A&B, COVID)  RVPGX2 - Abnormal; Notable for the following components:      Result Value   Influenza A by PCR POSITIVE (*)    All other components within normal limits      PROCEDURES:  Critical Care performed:   Procedures   MEDICATIONS ORDERED IN ED: Medications  ibuprofen (ADVIL) tablet  600 mg (600 mg Oral Given 04/04/23 1210)     IMPRESSION / MDM / ASSESSMENT AND PLAN / ED COURSE  I reviewed the triage vital signs and the nursing notes.   Differential diagnosis includes, but is not limited to, COVID, influenza, RSV, sinus congestion, upper respiratory infection, viral illness.  27 year old female presents to the ED with complaint of sinus pressure, nasal congestion, cough and fever for several days.  Patient was made aware that her respiratory panel came back positive for influenza A and that she is contagious.  A prescription for Bromfed-DM and ibuprofen 600 mg was sent to the pharmacy for her to begin taking.  She was also given ibuprofen while in the emergency department prior to discharge.      Patient's presentation is most consistent with acute complicated illness / injury requiring diagnostic workup.  FINAL CLINICAL IMPRESSION(S) / ED DIAGNOSES   Final diagnoses:  Influenza A     Rx / DC Orders   ED Discharge Orders  Ordered    brompheniramine-pseudoephedrine-DM 30-2-10 MG/5ML syrup  4 times daily PRN        04/04/23 1205    ibuprofen (ADVIL) 600 MG tablet  Every 6 hours PRN        04/04/23 1208             Note:  This document was prepared using Dragon voice recognition software and may include unintentional dictation errors.   Tommi Rumps, PA-C 04/04/23 1211    Minna Antis, MD 04/04/23 1527

## 2024-03-29 ENCOUNTER — Other Ambulatory Visit: Payer: Self-pay | Admitting: Physical Medicine & Rehabilitation

## 2024-03-29 DIAGNOSIS — G8929 Other chronic pain: Secondary | ICD-10-CM

## 2024-04-01 ENCOUNTER — Ambulatory Visit
Admission: RE | Admit: 2024-04-01 | Discharge: 2024-04-01 | Disposition: A | Discharge status: Home / Self Care | Source: Ambulatory Visit | Attending: Physical Medicine & Rehabilitation | Admitting: Physical Medicine & Rehabilitation

## 2024-04-01 DIAGNOSIS — G8929 Other chronic pain: Secondary | ICD-10-CM
